# Patient Record
Sex: Female | Born: 2005 | Race: White | Hispanic: No | Marital: Single | Smoking: Never smoker
Health system: Southern US, Community
[De-identification: ages and names within clinical notes are randomized; demographics above are authoritative.]

---
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
## 2024-02-27 ENCOUNTER — Encounter: Payer: Self-pay | Admitting: Emergency Medicine

## 2024-02-27 ENCOUNTER — Ambulatory Visit: Payer: Self-pay | Admitting: Emergency Medicine

## 2024-02-27 ENCOUNTER — Ambulatory Visit
Admission: EM | Admit: 2024-02-27 | Discharge: 2024-02-27 | Disposition: A | Discharge status: Home / Self Care | Attending: Emergency Medicine | Admitting: Emergency Medicine

## 2024-02-27 DIAGNOSIS — J029 Acute pharyngitis, unspecified: Secondary | ICD-10-CM

## 2024-02-27 LAB — MONONUCLEOSIS SCREEN: Mono Screen: NEGATIVE

## 2024-02-27 LAB — GROUP A STREP BY PCR: Group A Strep by PCR: NOT DETECTED

## 2024-02-27 MED ORDER — IBUPROFEN 600 MG PO TABS: 600.0000 mg | ORAL_TABLET | Freq: Four times a day (QID) | ORAL | 0 refills | Status: AC | PRN

## 2024-02-27 NOTE — ED Triage Notes (Signed)
 Pt c/o sore throat. Started about 4 days ago. Denies other URI symptoms.

## 2024-02-27 NOTE — ED Provider Notes (Signed)
 HPI  SUBJECTIVE:  Patient reports sore throat starting 4 days ago. Sx worse with talking, swallowing.  Sx better with nothing. Has been taking hot tea w/ o relief.  No fever + cervical lymphadenopathy  no neck stiffness  No Cough, wheezing No nasal congestion, rhinorrhea, postnasal drip No Myalgias No Headache No Rash  No loss of taste or smell No shortness of breath  No nausea, vomiting No diarrhea No abdominal pain     No Recent Strep, mono, flu, COVID exposure No reflux sxs No Allergy sxs  No Breathing difficulty, voice changes, sensation of throat swelling shut No Drooling No Trismus No abx in past month. All immunizations UTD.  No antipyretic in past 6 hrs    History reviewed. No pertinent past medical history.  History reviewed. No pertinent surgical history.  Family History  Problem Relation Age of Onset   Healthy Father     Social History   Tobacco Use   Smoking status: Never    Passive exposure: Never   Smokeless tobacco: Never  Vaping Use   Vaping status: Never Used  Substance Use Topics   Alcohol use: Never   Drug use: Never    No current facility-administered medications for this encounter.  Current Outpatient Medications:    ibuprofen (ADVIL) 600 MG tablet, Take 1 tablet (600 mg total) by mouth every 6 (six) hours as needed., Disp: 30 tablet, Rfl: 0  No Known Allergies   ROS  As noted in HPI.   Physical Exam  BP 132/86 (BP Location: Left Arm)   Pulse 84   Temp 99.1 F (37.3 C) (Oral)   Resp 16   Ht 5' 2 (1.575 m)   Wt 59.8 kg   LMP 02/16/2024 (Approximate)   SpO2 99%   BMI 24.11 kg/m   Constitutional: Well developed, well nourished, no acute distress Eyes:  EOMI, conjunctiva normal bilaterally HENT: Normocephalic, atraumatic,mucus membranes moist.  No nasal congestion.  Slightly erythematous oropharynx.  Enlarged tonsils without exudates.  Uvula normal size, midline.  Normal voice.  No neck stiffness.  No drooling,  trismus. Respiratory: Normal inspiratory effort Cardiovascular: Normal rate, no murmurs, rubs, gallops GI: nondistended, nontender. No appreciable splenomegaly skin: No rash, skin intact Lymph: Positive anterior, posterior cervical lymphadenopathy Musculoskeletal: no deformities Neurologic: Alert & oriented x 3, no focal neuro deficits Psychiatric: Speech and behavior appropriate.   ED Course   Medications - No data to display  Orders Placed This Encounter  Procedures   Group A Strep by PCR    Standing Status:   Standing    Number of Occurrences:   1   Mononucleosis screen    Standing Status:   Standing    Number of Occurrences:   1    Results for orders placed or performed during the hospital encounter of 02/27/24 (from the past 24 hours)  Group A Strep by PCR     Status: None   Collection Time: 02/27/24  8:24 AM   Specimen: Throat; Sterile Swab  Result Value Ref Range   Group A Strep by PCR NOT DETECTED NOT DETECTED  Mononucleosis screen     Status: None   Collection Time: 02/27/24  9:43 AM  Result Value Ref Range   Mono Screen NEGATIVE NEGATIVE   No results found.  ED Clinical Impression  1. Acute pharyngitis, unspecified etiology      ED Assessment/Plan    Strep PCR negative.  Checking mono due to anterior, posterior cervical lymphadenopathy.  No evidence of  epiglottitis, RPA, PTA.  She declined COVID testing. will contact patient [phone] if and only if it comes back positive.  In the meantime, patient home with ibuprofen, Tylenol, Benadryl/Maalox mixture.. Patient to followup with PCP when necessary.  Work note for tomorrow  Mono negative.  Plan as above.  Discussed labs,  MDM, plan and followup with patient. Discussed sn/sx that should prompt return to the ED. patient agrees with plan.   Meds ordered this encounter  Medications   ibuprofen (ADVIL) 600 MG tablet    Sig: Take 1 tablet (600 mg total) by mouth every 6 (six) hours as needed.     Dispense:  30 tablet    Refill:  0     *This clinic note was created using Scientist, clinical (histocompatibility and immunogenetics). Therefore, there may be occasional mistakes despite careful proofreading.     Van Knee, MD 02/27/24 NANCYANN

## 2024-02-27 NOTE — Discharge Instructions (Signed)
 Your strep PCR testing was negative today.  I will contact you if and only if your mono comes back positive.  We will discuss next steps at that time.  1 gram of Tylenol and 600 mg ibuprofen together 3-4 times a day as needed for pain.  Make sure you drink plenty of extra fluids.  Some people find salt water gargles and  Traditional Medicinal's Throat Coat tea helpful. Take 5 mL of liquid Benadryl and 5 mL of Maalox/Mylanta. Mix it together, and then hold it in your mouth for as long as you can and then swallow. You may do this 4 times a day.  Honey and lemon dissolved in hot water can also be soothing.  Go to www.goodrx.com  or www.costplusdrugs.com to look up your medications. This will give you a list of where you can find your prescriptions at the most affordable prices. Or ask the pharmacist what the cash price is, or if they have any other discount programs available to help make your medication more affordable. This can be less expensive than what you would pay with insurance.
